# Patient Record
Sex: Male | Born: 2000 | Race: Black or African American | Hispanic: No | Marital: Single | Smoking: Never smoker
Health system: Southern US, Community
[De-identification: ages and names within clinical notes are randomized; demographics above are authoritative.]

## PROBLEM LIST (undated history)

## (undated) DIAGNOSIS — F909 Attention-deficit hyperactivity disorder, unspecified type: Secondary | ICD-10-CM

## (undated) DIAGNOSIS — J45909 Unspecified asthma, uncomplicated: Secondary | ICD-10-CM

## (undated) HISTORY — PX: TONSILLECTOMY: SUR1361

## (undated) HISTORY — PX: BELOW KNEE LEG AMPUTATION: SUR23

---
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
## 2022-01-03 DIAGNOSIS — R262 Difficulty in walking, not elsewhere classified: Secondary | ICD-10-CM | POA: Diagnosis not present

## 2022-01-04 DIAGNOSIS — R262 Difficulty in walking, not elsewhere classified: Secondary | ICD-10-CM | POA: Diagnosis not present

## 2022-01-05 DIAGNOSIS — R262 Difficulty in walking, not elsewhere classified: Secondary | ICD-10-CM | POA: Diagnosis not present

## 2022-01-06 DIAGNOSIS — R262 Difficulty in walking, not elsewhere classified: Secondary | ICD-10-CM | POA: Diagnosis not present

## 2022-01-07 DIAGNOSIS — R262 Difficulty in walking, not elsewhere classified: Secondary | ICD-10-CM | POA: Diagnosis not present

## 2022-01-08 DIAGNOSIS — Z419 Encounter for procedure for purposes other than remedying health state, unspecified: Secondary | ICD-10-CM | POA: Diagnosis not present

## 2022-01-10 DIAGNOSIS — R262 Difficulty in walking, not elsewhere classified: Secondary | ICD-10-CM | POA: Diagnosis not present

## 2022-01-11 DIAGNOSIS — R262 Difficulty in walking, not elsewhere classified: Secondary | ICD-10-CM | POA: Diagnosis not present

## 2022-01-12 DIAGNOSIS — R262 Difficulty in walking, not elsewhere classified: Secondary | ICD-10-CM | POA: Diagnosis not present

## 2022-01-13 DIAGNOSIS — R262 Difficulty in walking, not elsewhere classified: Secondary | ICD-10-CM | POA: Diagnosis not present

## 2022-01-14 DIAGNOSIS — R262 Difficulty in walking, not elsewhere classified: Secondary | ICD-10-CM | POA: Diagnosis not present

## 2022-01-17 DIAGNOSIS — R262 Difficulty in walking, not elsewhere classified: Secondary | ICD-10-CM | POA: Diagnosis not present

## 2022-01-18 DIAGNOSIS — R262 Difficulty in walking, not elsewhere classified: Secondary | ICD-10-CM | POA: Diagnosis not present

## 2022-01-19 DIAGNOSIS — R262 Difficulty in walking, not elsewhere classified: Secondary | ICD-10-CM | POA: Diagnosis not present

## 2022-01-20 DIAGNOSIS — R262 Difficulty in walking, not elsewhere classified: Secondary | ICD-10-CM | POA: Diagnosis not present

## 2022-01-21 DIAGNOSIS — R262 Difficulty in walking, not elsewhere classified: Secondary | ICD-10-CM | POA: Diagnosis not present

## 2022-01-24 DIAGNOSIS — R262 Difficulty in walking, not elsewhere classified: Secondary | ICD-10-CM | POA: Diagnosis not present

## 2022-01-25 DIAGNOSIS — R262 Difficulty in walking, not elsewhere classified: Secondary | ICD-10-CM | POA: Diagnosis not present

## 2022-01-26 DIAGNOSIS — R262 Difficulty in walking, not elsewhere classified: Secondary | ICD-10-CM | POA: Diagnosis not present

## 2022-01-27 DIAGNOSIS — R262 Difficulty in walking, not elsewhere classified: Secondary | ICD-10-CM | POA: Diagnosis not present

## 2022-01-28 DIAGNOSIS — R262 Difficulty in walking, not elsewhere classified: Secondary | ICD-10-CM | POA: Diagnosis not present

## 2022-01-31 DIAGNOSIS — R262 Difficulty in walking, not elsewhere classified: Secondary | ICD-10-CM | POA: Diagnosis not present

## 2022-02-01 DIAGNOSIS — R262 Difficulty in walking, not elsewhere classified: Secondary | ICD-10-CM | POA: Diagnosis not present

## 2022-02-02 DIAGNOSIS — R262 Difficulty in walking, not elsewhere classified: Secondary | ICD-10-CM | POA: Diagnosis not present

## 2022-02-03 DIAGNOSIS — R262 Difficulty in walking, not elsewhere classified: Secondary | ICD-10-CM | POA: Diagnosis not present

## 2022-02-04 DIAGNOSIS — R262 Difficulty in walking, not elsewhere classified: Secondary | ICD-10-CM | POA: Diagnosis not present

## 2022-02-07 DIAGNOSIS — R262 Difficulty in walking, not elsewhere classified: Secondary | ICD-10-CM | POA: Diagnosis not present

## 2022-02-08 DIAGNOSIS — R262 Difficulty in walking, not elsewhere classified: Secondary | ICD-10-CM | POA: Diagnosis not present

## 2022-02-08 DIAGNOSIS — Z419 Encounter for procedure for purposes other than remedying health state, unspecified: Secondary | ICD-10-CM | POA: Diagnosis not present

## 2022-02-09 DIAGNOSIS — R262 Difficulty in walking, not elsewhere classified: Secondary | ICD-10-CM | POA: Diagnosis not present

## 2022-02-10 DIAGNOSIS — R262 Difficulty in walking, not elsewhere classified: Secondary | ICD-10-CM | POA: Diagnosis not present

## 2022-02-11 DIAGNOSIS — R262 Difficulty in walking, not elsewhere classified: Secondary | ICD-10-CM | POA: Diagnosis not present

## 2022-02-14 DIAGNOSIS — R262 Difficulty in walking, not elsewhere classified: Secondary | ICD-10-CM | POA: Diagnosis not present

## 2022-02-15 DIAGNOSIS — R262 Difficulty in walking, not elsewhere classified: Secondary | ICD-10-CM | POA: Diagnosis not present

## 2022-02-16 DIAGNOSIS — R262 Difficulty in walking, not elsewhere classified: Secondary | ICD-10-CM | POA: Diagnosis not present

## 2022-02-17 DIAGNOSIS — R262 Difficulty in walking, not elsewhere classified: Secondary | ICD-10-CM | POA: Diagnosis not present

## 2022-02-18 DIAGNOSIS — R262 Difficulty in walking, not elsewhere classified: Secondary | ICD-10-CM | POA: Diagnosis not present

## 2022-02-21 DIAGNOSIS — R262 Difficulty in walking, not elsewhere classified: Secondary | ICD-10-CM | POA: Diagnosis not present

## 2022-02-22 DIAGNOSIS — Z89511 Acquired absence of right leg below knee: Secondary | ICD-10-CM | POA: Diagnosis not present

## 2022-02-22 DIAGNOSIS — R262 Difficulty in walking, not elsewhere classified: Secondary | ICD-10-CM | POA: Diagnosis not present

## 2022-02-23 DIAGNOSIS — R262 Difficulty in walking, not elsewhere classified: Secondary | ICD-10-CM | POA: Diagnosis not present

## 2022-02-24 DIAGNOSIS — R262 Difficulty in walking, not elsewhere classified: Secondary | ICD-10-CM | POA: Diagnosis not present

## 2022-02-25 DIAGNOSIS — R262 Difficulty in walking, not elsewhere classified: Secondary | ICD-10-CM | POA: Diagnosis not present

## 2022-02-28 DIAGNOSIS — R262 Difficulty in walking, not elsewhere classified: Secondary | ICD-10-CM | POA: Diagnosis not present

## 2022-03-01 DIAGNOSIS — R262 Difficulty in walking, not elsewhere classified: Secondary | ICD-10-CM | POA: Diagnosis not present

## 2022-03-02 DIAGNOSIS — R262 Difficulty in walking, not elsewhere classified: Secondary | ICD-10-CM | POA: Diagnosis not present

## 2022-03-03 DIAGNOSIS — R262 Difficulty in walking, not elsewhere classified: Secondary | ICD-10-CM | POA: Diagnosis not present

## 2022-03-04 DIAGNOSIS — R262 Difficulty in walking, not elsewhere classified: Secondary | ICD-10-CM | POA: Diagnosis not present

## 2022-03-07 DIAGNOSIS — R262 Difficulty in walking, not elsewhere classified: Secondary | ICD-10-CM | POA: Diagnosis not present

## 2022-03-08 DIAGNOSIS — R262 Difficulty in walking, not elsewhere classified: Secondary | ICD-10-CM | POA: Diagnosis not present

## 2022-03-09 DIAGNOSIS — R262 Difficulty in walking, not elsewhere classified: Secondary | ICD-10-CM | POA: Diagnosis not present

## 2022-03-10 DIAGNOSIS — R262 Difficulty in walking, not elsewhere classified: Secondary | ICD-10-CM | POA: Diagnosis not present

## 2022-03-11 DIAGNOSIS — R262 Difficulty in walking, not elsewhere classified: Secondary | ICD-10-CM | POA: Diagnosis not present

## 2022-03-11 DIAGNOSIS — Z419 Encounter for procedure for purposes other than remedying health state, unspecified: Secondary | ICD-10-CM | POA: Diagnosis not present

## 2022-03-14 DIAGNOSIS — R262 Difficulty in walking, not elsewhere classified: Secondary | ICD-10-CM | POA: Diagnosis not present

## 2022-03-14 DIAGNOSIS — U071 COVID-19: Secondary | ICD-10-CM | POA: Diagnosis not present

## 2022-03-14 DIAGNOSIS — J029 Acute pharyngitis, unspecified: Secondary | ICD-10-CM | POA: Diagnosis not present

## 2022-03-15 DIAGNOSIS — R262 Difficulty in walking, not elsewhere classified: Secondary | ICD-10-CM | POA: Diagnosis not present

## 2022-03-16 DIAGNOSIS — R262 Difficulty in walking, not elsewhere classified: Secondary | ICD-10-CM | POA: Diagnosis not present

## 2022-03-17 DIAGNOSIS — R262 Difficulty in walking, not elsewhere classified: Secondary | ICD-10-CM | POA: Diagnosis not present

## 2022-03-21 DIAGNOSIS — R262 Difficulty in walking, not elsewhere classified: Secondary | ICD-10-CM | POA: Diagnosis not present

## 2022-03-22 DIAGNOSIS — R262 Difficulty in walking, not elsewhere classified: Secondary | ICD-10-CM | POA: Diagnosis not present

## 2022-03-23 DIAGNOSIS — R262 Difficulty in walking, not elsewhere classified: Secondary | ICD-10-CM | POA: Diagnosis not present

## 2022-03-24 DIAGNOSIS — R262 Difficulty in walking, not elsewhere classified: Secondary | ICD-10-CM | POA: Diagnosis not present

## 2022-03-28 DIAGNOSIS — R262 Difficulty in walking, not elsewhere classified: Secondary | ICD-10-CM | POA: Diagnosis not present

## 2022-03-29 DIAGNOSIS — R262 Difficulty in walking, not elsewhere classified: Secondary | ICD-10-CM | POA: Diagnosis not present

## 2022-03-30 DIAGNOSIS — R262 Difficulty in walking, not elsewhere classified: Secondary | ICD-10-CM | POA: Diagnosis not present

## 2022-03-31 DIAGNOSIS — R262 Difficulty in walking, not elsewhere classified: Secondary | ICD-10-CM | POA: Diagnosis not present

## 2022-04-04 DIAGNOSIS — R262 Difficulty in walking, not elsewhere classified: Secondary | ICD-10-CM | POA: Diagnosis not present

## 2022-04-05 DIAGNOSIS — R262 Difficulty in walking, not elsewhere classified: Secondary | ICD-10-CM | POA: Diagnosis not present

## 2022-04-06 DIAGNOSIS — R262 Difficulty in walking, not elsewhere classified: Secondary | ICD-10-CM | POA: Diagnosis not present

## 2022-04-07 DIAGNOSIS — R262 Difficulty in walking, not elsewhere classified: Secondary | ICD-10-CM | POA: Diagnosis not present

## 2022-04-10 DIAGNOSIS — Z419 Encounter for procedure for purposes other than remedying health state, unspecified: Secondary | ICD-10-CM | POA: Diagnosis not present

## 2022-04-11 DIAGNOSIS — R262 Difficulty in walking, not elsewhere classified: Secondary | ICD-10-CM | POA: Diagnosis not present

## 2022-04-12 DIAGNOSIS — R262 Difficulty in walking, not elsewhere classified: Secondary | ICD-10-CM | POA: Diagnosis not present

## 2022-04-13 DIAGNOSIS — R262 Difficulty in walking, not elsewhere classified: Secondary | ICD-10-CM | POA: Diagnosis not present

## 2022-04-14 DIAGNOSIS — R262 Difficulty in walking, not elsewhere classified: Secondary | ICD-10-CM | POA: Diagnosis not present

## 2022-04-18 DIAGNOSIS — R262 Difficulty in walking, not elsewhere classified: Secondary | ICD-10-CM | POA: Diagnosis not present

## 2022-04-19 DIAGNOSIS — R262 Difficulty in walking, not elsewhere classified: Secondary | ICD-10-CM | POA: Diagnosis not present

## 2022-04-20 DIAGNOSIS — R262 Difficulty in walking, not elsewhere classified: Secondary | ICD-10-CM | POA: Diagnosis not present

## 2022-04-21 DIAGNOSIS — R262 Difficulty in walking, not elsewhere classified: Secondary | ICD-10-CM | POA: Diagnosis not present

## 2022-04-25 DIAGNOSIS — R262 Difficulty in walking, not elsewhere classified: Secondary | ICD-10-CM | POA: Diagnosis not present

## 2022-04-26 DIAGNOSIS — R262 Difficulty in walking, not elsewhere classified: Secondary | ICD-10-CM | POA: Diagnosis not present

## 2022-04-27 DIAGNOSIS — R262 Difficulty in walking, not elsewhere classified: Secondary | ICD-10-CM | POA: Diagnosis not present

## 2022-04-28 DIAGNOSIS — R262 Difficulty in walking, not elsewhere classified: Secondary | ICD-10-CM | POA: Diagnosis not present

## 2022-05-02 DIAGNOSIS — R262 Difficulty in walking, not elsewhere classified: Secondary | ICD-10-CM | POA: Diagnosis not present

## 2022-05-03 DIAGNOSIS — R262 Difficulty in walking, not elsewhere classified: Secondary | ICD-10-CM | POA: Diagnosis not present

## 2022-05-04 DIAGNOSIS — R262 Difficulty in walking, not elsewhere classified: Secondary | ICD-10-CM | POA: Diagnosis not present

## 2022-05-05 DIAGNOSIS — R262 Difficulty in walking, not elsewhere classified: Secondary | ICD-10-CM | POA: Diagnosis not present

## 2022-05-09 DIAGNOSIS — R262 Difficulty in walking, not elsewhere classified: Secondary | ICD-10-CM | POA: Diagnosis not present

## 2022-05-10 DIAGNOSIS — R262 Difficulty in walking, not elsewhere classified: Secondary | ICD-10-CM | POA: Diagnosis not present

## 2022-05-11 DIAGNOSIS — R262 Difficulty in walking, not elsewhere classified: Secondary | ICD-10-CM | POA: Diagnosis not present

## 2022-05-11 DIAGNOSIS — Z419 Encounter for procedure for purposes other than remedying health state, unspecified: Secondary | ICD-10-CM | POA: Diagnosis not present

## 2022-05-12 DIAGNOSIS — R262 Difficulty in walking, not elsewhere classified: Secondary | ICD-10-CM | POA: Diagnosis not present

## 2022-05-16 DIAGNOSIS — R262 Difficulty in walking, not elsewhere classified: Secondary | ICD-10-CM | POA: Diagnosis not present

## 2022-05-17 DIAGNOSIS — R262 Difficulty in walking, not elsewhere classified: Secondary | ICD-10-CM | POA: Diagnosis not present

## 2022-05-18 DIAGNOSIS — R262 Difficulty in walking, not elsewhere classified: Secondary | ICD-10-CM | POA: Diagnosis not present

## 2022-05-19 DIAGNOSIS — R262 Difficulty in walking, not elsewhere classified: Secondary | ICD-10-CM | POA: Diagnosis not present

## 2022-05-20 DIAGNOSIS — R262 Difficulty in walking, not elsewhere classified: Secondary | ICD-10-CM | POA: Diagnosis not present

## 2022-05-23 DIAGNOSIS — R262 Difficulty in walking, not elsewhere classified: Secondary | ICD-10-CM | POA: Diagnosis not present

## 2022-05-24 DIAGNOSIS — R262 Difficulty in walking, not elsewhere classified: Secondary | ICD-10-CM | POA: Diagnosis not present

## 2022-05-25 DIAGNOSIS — R262 Difficulty in walking, not elsewhere classified: Secondary | ICD-10-CM | POA: Diagnosis not present

## 2022-05-26 DIAGNOSIS — R262 Difficulty in walking, not elsewhere classified: Secondary | ICD-10-CM | POA: Diagnosis not present

## 2022-05-27 DIAGNOSIS — R262 Difficulty in walking, not elsewhere classified: Secondary | ICD-10-CM | POA: Diagnosis not present

## 2022-05-30 DIAGNOSIS — R262 Difficulty in walking, not elsewhere classified: Secondary | ICD-10-CM | POA: Diagnosis not present

## 2022-05-31 DIAGNOSIS — R262 Difficulty in walking, not elsewhere classified: Secondary | ICD-10-CM | POA: Diagnosis not present

## 2022-06-01 DIAGNOSIS — R262 Difficulty in walking, not elsewhere classified: Secondary | ICD-10-CM | POA: Diagnosis not present

## 2022-06-02 DIAGNOSIS — R262 Difficulty in walking, not elsewhere classified: Secondary | ICD-10-CM | POA: Diagnosis not present

## 2022-06-03 DIAGNOSIS — R262 Difficulty in walking, not elsewhere classified: Secondary | ICD-10-CM | POA: Diagnosis not present

## 2022-06-06 DIAGNOSIS — R262 Difficulty in walking, not elsewhere classified: Secondary | ICD-10-CM | POA: Diagnosis not present

## 2022-06-07 DIAGNOSIS — R262 Difficulty in walking, not elsewhere classified: Secondary | ICD-10-CM | POA: Diagnosis not present

## 2022-06-08 DIAGNOSIS — R262 Difficulty in walking, not elsewhere classified: Secondary | ICD-10-CM | POA: Diagnosis not present

## 2022-06-09 DIAGNOSIS — R262 Difficulty in walking, not elsewhere classified: Secondary | ICD-10-CM | POA: Diagnosis not present

## 2022-06-10 DIAGNOSIS — Z419 Encounter for procedure for purposes other than remedying health state, unspecified: Secondary | ICD-10-CM | POA: Diagnosis not present

## 2022-06-10 DIAGNOSIS — R262 Difficulty in walking, not elsewhere classified: Secondary | ICD-10-CM | POA: Diagnosis not present

## 2022-06-13 DIAGNOSIS — R262 Difficulty in walking, not elsewhere classified: Secondary | ICD-10-CM | POA: Diagnosis not present

## 2022-06-14 DIAGNOSIS — R262 Difficulty in walking, not elsewhere classified: Secondary | ICD-10-CM | POA: Diagnosis not present

## 2022-06-15 DIAGNOSIS — R262 Difficulty in walking, not elsewhere classified: Secondary | ICD-10-CM | POA: Diagnosis not present

## 2022-06-16 DIAGNOSIS — R262 Difficulty in walking, not elsewhere classified: Secondary | ICD-10-CM | POA: Diagnosis not present

## 2022-06-17 DIAGNOSIS — R262 Difficulty in walking, not elsewhere classified: Secondary | ICD-10-CM | POA: Diagnosis not present

## 2022-06-20 DIAGNOSIS — R262 Difficulty in walking, not elsewhere classified: Secondary | ICD-10-CM | POA: Diagnosis not present

## 2022-06-21 DIAGNOSIS — R262 Difficulty in walking, not elsewhere classified: Secondary | ICD-10-CM | POA: Diagnosis not present

## 2022-06-22 DIAGNOSIS — R262 Difficulty in walking, not elsewhere classified: Secondary | ICD-10-CM | POA: Diagnosis not present

## 2022-06-23 DIAGNOSIS — R262 Difficulty in walking, not elsewhere classified: Secondary | ICD-10-CM | POA: Diagnosis not present

## 2022-06-24 DIAGNOSIS — R262 Difficulty in walking, not elsewhere classified: Secondary | ICD-10-CM | POA: Diagnosis not present

## 2022-06-27 DIAGNOSIS — R262 Difficulty in walking, not elsewhere classified: Secondary | ICD-10-CM | POA: Diagnosis not present

## 2022-06-28 DIAGNOSIS — R262 Difficulty in walking, not elsewhere classified: Secondary | ICD-10-CM | POA: Diagnosis not present

## 2022-06-29 DIAGNOSIS — R262 Difficulty in walking, not elsewhere classified: Secondary | ICD-10-CM | POA: Diagnosis not present

## 2022-06-30 DIAGNOSIS — R262 Difficulty in walking, not elsewhere classified: Secondary | ICD-10-CM | POA: Diagnosis not present

## 2022-07-01 DIAGNOSIS — R262 Difficulty in walking, not elsewhere classified: Secondary | ICD-10-CM | POA: Diagnosis not present

## 2022-07-04 DIAGNOSIS — R262 Difficulty in walking, not elsewhere classified: Secondary | ICD-10-CM | POA: Diagnosis not present

## 2022-07-05 DIAGNOSIS — R262 Difficulty in walking, not elsewhere classified: Secondary | ICD-10-CM | POA: Diagnosis not present

## 2022-07-06 DIAGNOSIS — R262 Difficulty in walking, not elsewhere classified: Secondary | ICD-10-CM | POA: Diagnosis not present

## 2022-07-07 DIAGNOSIS — R262 Difficulty in walking, not elsewhere classified: Secondary | ICD-10-CM | POA: Diagnosis not present

## 2022-07-08 DIAGNOSIS — R262 Difficulty in walking, not elsewhere classified: Secondary | ICD-10-CM | POA: Diagnosis not present

## 2022-07-11 DIAGNOSIS — R262 Difficulty in walking, not elsewhere classified: Secondary | ICD-10-CM | POA: Diagnosis not present

## 2022-07-12 DIAGNOSIS — R262 Difficulty in walking, not elsewhere classified: Secondary | ICD-10-CM | POA: Diagnosis not present

## 2022-07-13 DIAGNOSIS — R262 Difficulty in walking, not elsewhere classified: Secondary | ICD-10-CM | POA: Diagnosis not present

## 2022-07-14 DIAGNOSIS — R262 Difficulty in walking, not elsewhere classified: Secondary | ICD-10-CM | POA: Diagnosis not present

## 2022-07-15 DIAGNOSIS — R262 Difficulty in walking, not elsewhere classified: Secondary | ICD-10-CM | POA: Diagnosis not present

## 2022-07-18 DIAGNOSIS — R262 Difficulty in walking, not elsewhere classified: Secondary | ICD-10-CM | POA: Diagnosis not present

## 2022-07-19 DIAGNOSIS — R262 Difficulty in walking, not elsewhere classified: Secondary | ICD-10-CM | POA: Diagnosis not present

## 2022-07-20 DIAGNOSIS — R262 Difficulty in walking, not elsewhere classified: Secondary | ICD-10-CM | POA: Diagnosis not present

## 2022-07-21 DIAGNOSIS — R262 Difficulty in walking, not elsewhere classified: Secondary | ICD-10-CM | POA: Diagnosis not present

## 2022-07-22 DIAGNOSIS — R262 Difficulty in walking, not elsewhere classified: Secondary | ICD-10-CM | POA: Diagnosis not present

## 2022-07-25 DIAGNOSIS — R262 Difficulty in walking, not elsewhere classified: Secondary | ICD-10-CM | POA: Diagnosis not present

## 2022-07-26 DIAGNOSIS — R262 Difficulty in walking, not elsewhere classified: Secondary | ICD-10-CM | POA: Diagnosis not present

## 2022-07-27 DIAGNOSIS — R262 Difficulty in walking, not elsewhere classified: Secondary | ICD-10-CM | POA: Diagnosis not present

## 2022-07-28 DIAGNOSIS — R262 Difficulty in walking, not elsewhere classified: Secondary | ICD-10-CM | POA: Diagnosis not present

## 2022-07-29 DIAGNOSIS — R262 Difficulty in walking, not elsewhere classified: Secondary | ICD-10-CM | POA: Diagnosis not present

## 2022-08-01 DIAGNOSIS — R262 Difficulty in walking, not elsewhere classified: Secondary | ICD-10-CM | POA: Diagnosis not present

## 2022-08-02 DIAGNOSIS — R262 Difficulty in walking, not elsewhere classified: Secondary | ICD-10-CM | POA: Diagnosis not present

## 2022-08-03 DIAGNOSIS — R262 Difficulty in walking, not elsewhere classified: Secondary | ICD-10-CM | POA: Diagnosis not present

## 2022-08-04 DIAGNOSIS — R262 Difficulty in walking, not elsewhere classified: Secondary | ICD-10-CM | POA: Diagnosis not present

## 2022-08-05 DIAGNOSIS — R262 Difficulty in walking, not elsewhere classified: Secondary | ICD-10-CM | POA: Diagnosis not present

## 2022-08-08 DIAGNOSIS — R262 Difficulty in walking, not elsewhere classified: Secondary | ICD-10-CM | POA: Diagnosis not present

## 2022-08-09 DIAGNOSIS — R262 Difficulty in walking, not elsewhere classified: Secondary | ICD-10-CM | POA: Diagnosis not present

## 2022-08-10 DIAGNOSIS — R262 Difficulty in walking, not elsewhere classified: Secondary | ICD-10-CM | POA: Diagnosis not present

## 2022-08-11 DIAGNOSIS — Z419 Encounter for procedure for purposes other than remedying health state, unspecified: Secondary | ICD-10-CM | POA: Diagnosis not present

## 2022-08-11 DIAGNOSIS — R262 Difficulty in walking, not elsewhere classified: Secondary | ICD-10-CM | POA: Diagnosis not present

## 2022-08-12 DIAGNOSIS — R262 Difficulty in walking, not elsewhere classified: Secondary | ICD-10-CM | POA: Diagnosis not present

## 2022-08-16 DIAGNOSIS — R262 Difficulty in walking, not elsewhere classified: Secondary | ICD-10-CM | POA: Diagnosis not present

## 2022-08-17 DIAGNOSIS — R262 Difficulty in walking, not elsewhere classified: Secondary | ICD-10-CM | POA: Diagnosis not present

## 2022-08-18 DIAGNOSIS — R262 Difficulty in walking, not elsewhere classified: Secondary | ICD-10-CM | POA: Diagnosis not present

## 2022-08-19 DIAGNOSIS — R262 Difficulty in walking, not elsewhere classified: Secondary | ICD-10-CM | POA: Diagnosis not present

## 2022-08-22 DIAGNOSIS — R262 Difficulty in walking, not elsewhere classified: Secondary | ICD-10-CM | POA: Diagnosis not present

## 2022-08-23 DIAGNOSIS — R262 Difficulty in walking, not elsewhere classified: Secondary | ICD-10-CM | POA: Diagnosis not present

## 2022-08-24 DIAGNOSIS — R262 Difficulty in walking, not elsewhere classified: Secondary | ICD-10-CM | POA: Diagnosis not present

## 2022-08-25 DIAGNOSIS — R262 Difficulty in walking, not elsewhere classified: Secondary | ICD-10-CM | POA: Diagnosis not present

## 2022-08-26 DIAGNOSIS — R262 Difficulty in walking, not elsewhere classified: Secondary | ICD-10-CM | POA: Diagnosis not present

## 2022-08-29 DIAGNOSIS — R262 Difficulty in walking, not elsewhere classified: Secondary | ICD-10-CM | POA: Diagnosis not present

## 2022-08-30 DIAGNOSIS — R262 Difficulty in walking, not elsewhere classified: Secondary | ICD-10-CM | POA: Diagnosis not present

## 2022-08-31 DIAGNOSIS — R262 Difficulty in walking, not elsewhere classified: Secondary | ICD-10-CM | POA: Diagnosis not present

## 2022-09-01 DIAGNOSIS — R262 Difficulty in walking, not elsewhere classified: Secondary | ICD-10-CM | POA: Diagnosis not present

## 2022-09-02 DIAGNOSIS — R262 Difficulty in walking, not elsewhere classified: Secondary | ICD-10-CM | POA: Diagnosis not present

## 2022-09-05 DIAGNOSIS — R262 Difficulty in walking, not elsewhere classified: Secondary | ICD-10-CM | POA: Diagnosis not present

## 2022-09-06 DIAGNOSIS — R262 Difficulty in walking, not elsewhere classified: Secondary | ICD-10-CM | POA: Diagnosis not present

## 2022-09-07 DIAGNOSIS — R262 Difficulty in walking, not elsewhere classified: Secondary | ICD-10-CM | POA: Diagnosis not present

## 2022-09-08 DIAGNOSIS — R262 Difficulty in walking, not elsewhere classified: Secondary | ICD-10-CM | POA: Diagnosis not present

## 2022-09-09 DIAGNOSIS — R262 Difficulty in walking, not elsewhere classified: Secondary | ICD-10-CM | POA: Diagnosis not present

## 2022-09-09 DIAGNOSIS — Z419 Encounter for procedure for purposes other than remedying health state, unspecified: Secondary | ICD-10-CM | POA: Diagnosis not present

## 2022-09-12 DIAGNOSIS — R262 Difficulty in walking, not elsewhere classified: Secondary | ICD-10-CM | POA: Diagnosis not present

## 2022-09-13 DIAGNOSIS — R262 Difficulty in walking, not elsewhere classified: Secondary | ICD-10-CM | POA: Diagnosis not present

## 2022-09-14 DIAGNOSIS — R262 Difficulty in walking, not elsewhere classified: Secondary | ICD-10-CM | POA: Diagnosis not present

## 2022-09-15 DIAGNOSIS — R262 Difficulty in walking, not elsewhere classified: Secondary | ICD-10-CM | POA: Diagnosis not present

## 2022-09-16 DIAGNOSIS — R262 Difficulty in walking, not elsewhere classified: Secondary | ICD-10-CM | POA: Diagnosis not present

## 2022-09-19 DIAGNOSIS — R262 Difficulty in walking, not elsewhere classified: Secondary | ICD-10-CM | POA: Diagnosis not present

## 2022-09-20 DIAGNOSIS — R262 Difficulty in walking, not elsewhere classified: Secondary | ICD-10-CM | POA: Diagnosis not present

## 2022-09-21 DIAGNOSIS — R262 Difficulty in walking, not elsewhere classified: Secondary | ICD-10-CM | POA: Diagnosis not present

## 2022-09-22 DIAGNOSIS — R262 Difficulty in walking, not elsewhere classified: Secondary | ICD-10-CM | POA: Diagnosis not present

## 2022-09-23 DIAGNOSIS — R262 Difficulty in walking, not elsewhere classified: Secondary | ICD-10-CM | POA: Diagnosis not present

## 2022-09-26 DIAGNOSIS — M542 Cervicalgia: Secondary | ICD-10-CM | POA: Diagnosis not present

## 2022-09-26 DIAGNOSIS — G44311 Acute post-traumatic headache, intractable: Secondary | ICD-10-CM | POA: Diagnosis not present

## 2022-09-26 DIAGNOSIS — R262 Difficulty in walking, not elsewhere classified: Secondary | ICD-10-CM | POA: Diagnosis not present

## 2022-09-26 DIAGNOSIS — M546 Pain in thoracic spine: Secondary | ICD-10-CM | POA: Diagnosis not present

## 2022-09-27 DIAGNOSIS — R262 Difficulty in walking, not elsewhere classified: Secondary | ICD-10-CM | POA: Diagnosis not present

## 2022-09-28 DIAGNOSIS — R262 Difficulty in walking, not elsewhere classified: Secondary | ICD-10-CM | POA: Diagnosis not present

## 2022-09-29 DIAGNOSIS — R262 Difficulty in walking, not elsewhere classified: Secondary | ICD-10-CM | POA: Diagnosis not present

## 2022-10-03 DIAGNOSIS — R262 Difficulty in walking, not elsewhere classified: Secondary | ICD-10-CM | POA: Diagnosis not present

## 2022-10-04 DIAGNOSIS — R262 Difficulty in walking, not elsewhere classified: Secondary | ICD-10-CM | POA: Diagnosis not present

## 2022-10-05 DIAGNOSIS — R262 Difficulty in walking, not elsewhere classified: Secondary | ICD-10-CM | POA: Diagnosis not present

## 2022-10-06 DIAGNOSIS — R262 Difficulty in walking, not elsewhere classified: Secondary | ICD-10-CM | POA: Diagnosis not present

## 2022-10-10 DIAGNOSIS — R262 Difficulty in walking, not elsewhere classified: Secondary | ICD-10-CM | POA: Diagnosis not present

## 2022-10-10 DIAGNOSIS — Z419 Encounter for procedure for purposes other than remedying health state, unspecified: Secondary | ICD-10-CM | POA: Diagnosis not present

## 2022-10-11 DIAGNOSIS — R262 Difficulty in walking, not elsewhere classified: Secondary | ICD-10-CM | POA: Diagnosis not present

## 2022-10-12 DIAGNOSIS — R262 Difficulty in walking, not elsewhere classified: Secondary | ICD-10-CM | POA: Diagnosis not present

## 2022-10-13 DIAGNOSIS — R262 Difficulty in walking, not elsewhere classified: Secondary | ICD-10-CM | POA: Diagnosis not present

## 2022-10-17 DIAGNOSIS — R262 Difficulty in walking, not elsewhere classified: Secondary | ICD-10-CM | POA: Diagnosis not present

## 2022-10-18 DIAGNOSIS — R262 Difficulty in walking, not elsewhere classified: Secondary | ICD-10-CM | POA: Diagnosis not present

## 2022-10-19 DIAGNOSIS — R262 Difficulty in walking, not elsewhere classified: Secondary | ICD-10-CM | POA: Diagnosis not present

## 2022-10-20 DIAGNOSIS — R262 Difficulty in walking, not elsewhere classified: Secondary | ICD-10-CM | POA: Diagnosis not present

## 2022-10-24 DIAGNOSIS — R262 Difficulty in walking, not elsewhere classified: Secondary | ICD-10-CM | POA: Diagnosis not present

## 2022-10-25 DIAGNOSIS — R262 Difficulty in walking, not elsewhere classified: Secondary | ICD-10-CM | POA: Diagnosis not present

## 2022-10-26 DIAGNOSIS — R262 Difficulty in walking, not elsewhere classified: Secondary | ICD-10-CM | POA: Diagnosis not present

## 2022-10-27 DIAGNOSIS — R262 Difficulty in walking, not elsewhere classified: Secondary | ICD-10-CM | POA: Diagnosis not present

## 2022-10-31 DIAGNOSIS — R262 Difficulty in walking, not elsewhere classified: Secondary | ICD-10-CM | POA: Diagnosis not present

## 2022-11-01 DIAGNOSIS — Z20822 Contact with and (suspected) exposure to covid-19: Secondary | ICD-10-CM | POA: Diagnosis not present

## 2022-11-01 DIAGNOSIS — R6889 Other general symptoms and signs: Secondary | ICD-10-CM | POA: Diagnosis not present

## 2022-11-01 DIAGNOSIS — R059 Cough, unspecified: Secondary | ICD-10-CM | POA: Diagnosis not present

## 2022-11-02 DIAGNOSIS — R262 Difficulty in walking, not elsewhere classified: Secondary | ICD-10-CM | POA: Diagnosis not present

## 2022-11-03 DIAGNOSIS — R262 Difficulty in walking, not elsewhere classified: Secondary | ICD-10-CM | POA: Diagnosis not present

## 2022-11-04 DIAGNOSIS — J4521 Mild intermittent asthma with (acute) exacerbation: Secondary | ICD-10-CM | POA: Diagnosis not present

## 2022-11-04 DIAGNOSIS — R262 Difficulty in walking, not elsewhere classified: Secondary | ICD-10-CM | POA: Diagnosis not present

## 2022-11-04 DIAGNOSIS — J029 Acute pharyngitis, unspecified: Secondary | ICD-10-CM | POA: Diagnosis not present

## 2022-11-04 DIAGNOSIS — R062 Wheezing: Secondary | ICD-10-CM | POA: Diagnosis not present

## 2022-11-07 DIAGNOSIS — R262 Difficulty in walking, not elsewhere classified: Secondary | ICD-10-CM | POA: Diagnosis not present

## 2022-11-08 DIAGNOSIS — R262 Difficulty in walking, not elsewhere classified: Secondary | ICD-10-CM | POA: Diagnosis not present

## 2022-11-09 DIAGNOSIS — R262 Difficulty in walking, not elsewhere classified: Secondary | ICD-10-CM | POA: Diagnosis not present

## 2022-11-09 DIAGNOSIS — Z419 Encounter for procedure for purposes other than remedying health state, unspecified: Secondary | ICD-10-CM | POA: Diagnosis not present

## 2022-11-10 DIAGNOSIS — R262 Difficulty in walking, not elsewhere classified: Secondary | ICD-10-CM | POA: Diagnosis not present

## 2022-11-14 DIAGNOSIS — R262 Difficulty in walking, not elsewhere classified: Secondary | ICD-10-CM | POA: Diagnosis not present

## 2022-11-15 DIAGNOSIS — R262 Difficulty in walking, not elsewhere classified: Secondary | ICD-10-CM | POA: Diagnosis not present

## 2022-11-16 DIAGNOSIS — R262 Difficulty in walking, not elsewhere classified: Secondary | ICD-10-CM | POA: Diagnosis not present

## 2022-11-17 DIAGNOSIS — R262 Difficulty in walking, not elsewhere classified: Secondary | ICD-10-CM | POA: Diagnosis not present

## 2022-11-21 DIAGNOSIS — R262 Difficulty in walking, not elsewhere classified: Secondary | ICD-10-CM | POA: Diagnosis not present

## 2022-11-22 DIAGNOSIS — R262 Difficulty in walking, not elsewhere classified: Secondary | ICD-10-CM | POA: Diagnosis not present

## 2022-11-23 DIAGNOSIS — R262 Difficulty in walking, not elsewhere classified: Secondary | ICD-10-CM | POA: Diagnosis not present

## 2022-11-24 DIAGNOSIS — R262 Difficulty in walking, not elsewhere classified: Secondary | ICD-10-CM | POA: Diagnosis not present

## 2022-11-28 DIAGNOSIS — R262 Difficulty in walking, not elsewhere classified: Secondary | ICD-10-CM | POA: Diagnosis not present

## 2022-11-29 DIAGNOSIS — R262 Difficulty in walking, not elsewhere classified: Secondary | ICD-10-CM | POA: Diagnosis not present

## 2022-11-30 DIAGNOSIS — R262 Difficulty in walking, not elsewhere classified: Secondary | ICD-10-CM | POA: Diagnosis not present

## 2022-12-01 DIAGNOSIS — R262 Difficulty in walking, not elsewhere classified: Secondary | ICD-10-CM | POA: Diagnosis not present

## 2022-12-05 DIAGNOSIS — R262 Difficulty in walking, not elsewhere classified: Secondary | ICD-10-CM | POA: Diagnosis not present

## 2022-12-06 DIAGNOSIS — R262 Difficulty in walking, not elsewhere classified: Secondary | ICD-10-CM | POA: Diagnosis not present

## 2022-12-07 DIAGNOSIS — R262 Difficulty in walking, not elsewhere classified: Secondary | ICD-10-CM | POA: Diagnosis not present

## 2022-12-08 DIAGNOSIS — R262 Difficulty in walking, not elsewhere classified: Secondary | ICD-10-CM | POA: Diagnosis not present

## 2022-12-10 DIAGNOSIS — Z419 Encounter for procedure for purposes other than remedying health state, unspecified: Secondary | ICD-10-CM | POA: Diagnosis not present

## 2022-12-12 DIAGNOSIS — R262 Difficulty in walking, not elsewhere classified: Secondary | ICD-10-CM | POA: Diagnosis not present

## 2022-12-13 DIAGNOSIS — R262 Difficulty in walking, not elsewhere classified: Secondary | ICD-10-CM | POA: Diagnosis not present

## 2022-12-14 DIAGNOSIS — R262 Difficulty in walking, not elsewhere classified: Secondary | ICD-10-CM | POA: Diagnosis not present

## 2022-12-15 DIAGNOSIS — R262 Difficulty in walking, not elsewhere classified: Secondary | ICD-10-CM | POA: Diagnosis not present

## 2022-12-19 DIAGNOSIS — R262 Difficulty in walking, not elsewhere classified: Secondary | ICD-10-CM | POA: Diagnosis not present

## 2022-12-20 DIAGNOSIS — R262 Difficulty in walking, not elsewhere classified: Secondary | ICD-10-CM | POA: Diagnosis not present

## 2022-12-21 DIAGNOSIS — R262 Difficulty in walking, not elsewhere classified: Secondary | ICD-10-CM | POA: Diagnosis not present

## 2022-12-23 DIAGNOSIS — R262 Difficulty in walking, not elsewhere classified: Secondary | ICD-10-CM | POA: Diagnosis not present

## 2022-12-26 DIAGNOSIS — R262 Difficulty in walking, not elsewhere classified: Secondary | ICD-10-CM | POA: Diagnosis not present

## 2022-12-27 DIAGNOSIS — R262 Difficulty in walking, not elsewhere classified: Secondary | ICD-10-CM | POA: Diagnosis not present

## 2022-12-28 DIAGNOSIS — R262 Difficulty in walking, not elsewhere classified: Secondary | ICD-10-CM | POA: Diagnosis not present

## 2022-12-29 DIAGNOSIS — R262 Difficulty in walking, not elsewhere classified: Secondary | ICD-10-CM | POA: Diagnosis not present

## 2023-01-02 DIAGNOSIS — R262 Difficulty in walking, not elsewhere classified: Secondary | ICD-10-CM | POA: Diagnosis not present

## 2023-01-03 DIAGNOSIS — R262 Difficulty in walking, not elsewhere classified: Secondary | ICD-10-CM | POA: Diagnosis not present

## 2023-01-04 DIAGNOSIS — R262 Difficulty in walking, not elsewhere classified: Secondary | ICD-10-CM | POA: Diagnosis not present

## 2023-01-05 DIAGNOSIS — R262 Difficulty in walking, not elsewhere classified: Secondary | ICD-10-CM | POA: Diagnosis not present

## 2023-01-09 DIAGNOSIS — Z419 Encounter for procedure for purposes other than remedying health state, unspecified: Secondary | ICD-10-CM | POA: Diagnosis not present

## 2023-01-09 DIAGNOSIS — R262 Difficulty in walking, not elsewhere classified: Secondary | ICD-10-CM | POA: Diagnosis not present

## 2023-01-10 DIAGNOSIS — R262 Difficulty in walking, not elsewhere classified: Secondary | ICD-10-CM | POA: Diagnosis not present

## 2023-01-11 DIAGNOSIS — R262 Difficulty in walking, not elsewhere classified: Secondary | ICD-10-CM | POA: Diagnosis not present

## 2023-01-12 DIAGNOSIS — R262 Difficulty in walking, not elsewhere classified: Secondary | ICD-10-CM | POA: Diagnosis not present

## 2023-01-16 DIAGNOSIS — R262 Difficulty in walking, not elsewhere classified: Secondary | ICD-10-CM | POA: Diagnosis not present

## 2023-01-17 DIAGNOSIS — R262 Difficulty in walking, not elsewhere classified: Secondary | ICD-10-CM | POA: Diagnosis not present

## 2023-01-18 DIAGNOSIS — R262 Difficulty in walking, not elsewhere classified: Secondary | ICD-10-CM | POA: Diagnosis not present

## 2023-01-19 DIAGNOSIS — R262 Difficulty in walking, not elsewhere classified: Secondary | ICD-10-CM | POA: Diagnosis not present

## 2023-01-23 DIAGNOSIS — R262 Difficulty in walking, not elsewhere classified: Secondary | ICD-10-CM | POA: Diagnosis not present

## 2023-01-24 DIAGNOSIS — R262 Difficulty in walking, not elsewhere classified: Secondary | ICD-10-CM | POA: Diagnosis not present

## 2023-01-25 DIAGNOSIS — R262 Difficulty in walking, not elsewhere classified: Secondary | ICD-10-CM | POA: Diagnosis not present

## 2023-01-26 DIAGNOSIS — R262 Difficulty in walking, not elsewhere classified: Secondary | ICD-10-CM | POA: Diagnosis not present

## 2023-01-30 DIAGNOSIS — R262 Difficulty in walking, not elsewhere classified: Secondary | ICD-10-CM | POA: Diagnosis not present

## 2023-01-31 DIAGNOSIS — R262 Difficulty in walking, not elsewhere classified: Secondary | ICD-10-CM | POA: Diagnosis not present

## 2023-02-01 DIAGNOSIS — R262 Difficulty in walking, not elsewhere classified: Secondary | ICD-10-CM | POA: Diagnosis not present

## 2023-02-03 DIAGNOSIS — R262 Difficulty in walking, not elsewhere classified: Secondary | ICD-10-CM | POA: Diagnosis not present

## 2023-02-06 DIAGNOSIS — R262 Difficulty in walking, not elsewhere classified: Secondary | ICD-10-CM | POA: Diagnosis not present

## 2023-02-07 DIAGNOSIS — R262 Difficulty in walking, not elsewhere classified: Secondary | ICD-10-CM | POA: Diagnosis not present

## 2023-02-08 DIAGNOSIS — R262 Difficulty in walking, not elsewhere classified: Secondary | ICD-10-CM | POA: Diagnosis not present

## 2023-02-09 DIAGNOSIS — Z419 Encounter for procedure for purposes other than remedying health state, unspecified: Secondary | ICD-10-CM | POA: Diagnosis not present

## 2023-02-09 DIAGNOSIS — R262 Difficulty in walking, not elsewhere classified: Secondary | ICD-10-CM | POA: Diagnosis not present

## 2023-02-13 DIAGNOSIS — R262 Difficulty in walking, not elsewhere classified: Secondary | ICD-10-CM | POA: Diagnosis not present

## 2023-02-14 DIAGNOSIS — R262 Difficulty in walking, not elsewhere classified: Secondary | ICD-10-CM | POA: Diagnosis not present

## 2023-02-15 DIAGNOSIS — R262 Difficulty in walking, not elsewhere classified: Secondary | ICD-10-CM | POA: Diagnosis not present

## 2023-02-16 DIAGNOSIS — R262 Difficulty in walking, not elsewhere classified: Secondary | ICD-10-CM | POA: Diagnosis not present

## 2023-02-20 DIAGNOSIS — R262 Difficulty in walking, not elsewhere classified: Secondary | ICD-10-CM | POA: Diagnosis not present

## 2023-02-21 DIAGNOSIS — R262 Difficulty in walking, not elsewhere classified: Secondary | ICD-10-CM | POA: Diagnosis not present

## 2023-02-22 DIAGNOSIS — R262 Difficulty in walking, not elsewhere classified: Secondary | ICD-10-CM | POA: Diagnosis not present

## 2023-02-23 DIAGNOSIS — R262 Difficulty in walking, not elsewhere classified: Secondary | ICD-10-CM | POA: Diagnosis not present

## 2023-02-27 DIAGNOSIS — R262 Difficulty in walking, not elsewhere classified: Secondary | ICD-10-CM | POA: Diagnosis not present

## 2023-02-28 DIAGNOSIS — R262 Difficulty in walking, not elsewhere classified: Secondary | ICD-10-CM | POA: Diagnosis not present

## 2023-03-01 DIAGNOSIS — R262 Difficulty in walking, not elsewhere classified: Secondary | ICD-10-CM | POA: Diagnosis not present

## 2023-03-02 DIAGNOSIS — R262 Difficulty in walking, not elsewhere classified: Secondary | ICD-10-CM | POA: Diagnosis not present

## 2023-03-06 DIAGNOSIS — R262 Difficulty in walking, not elsewhere classified: Secondary | ICD-10-CM | POA: Diagnosis not present

## 2023-03-07 DIAGNOSIS — R262 Difficulty in walking, not elsewhere classified: Secondary | ICD-10-CM | POA: Diagnosis not present

## 2023-03-08 DIAGNOSIS — R262 Difficulty in walking, not elsewhere classified: Secondary | ICD-10-CM | POA: Diagnosis not present

## 2023-03-09 DIAGNOSIS — R262 Difficulty in walking, not elsewhere classified: Secondary | ICD-10-CM | POA: Diagnosis not present

## 2023-03-12 DIAGNOSIS — Z419 Encounter for procedure for purposes other than remedying health state, unspecified: Secondary | ICD-10-CM | POA: Diagnosis not present

## 2023-03-13 DIAGNOSIS — R262 Difficulty in walking, not elsewhere classified: Secondary | ICD-10-CM | POA: Diagnosis not present

## 2023-03-14 DIAGNOSIS — R262 Difficulty in walking, not elsewhere classified: Secondary | ICD-10-CM | POA: Diagnosis not present

## 2023-03-15 DIAGNOSIS — Z978 Presence of other specified devices: Secondary | ICD-10-CM | POA: Diagnosis not present

## 2023-03-15 DIAGNOSIS — R238 Other skin changes: Secondary | ICD-10-CM | POA: Diagnosis not present

## 2023-03-15 DIAGNOSIS — Z89511 Acquired absence of right leg below knee: Secondary | ICD-10-CM | POA: Diagnosis not present

## 2023-03-15 DIAGNOSIS — R262 Difficulty in walking, not elsewhere classified: Secondary | ICD-10-CM | POA: Diagnosis not present

## 2023-03-15 DIAGNOSIS — F4310 Post-traumatic stress disorder, unspecified: Secondary | ICD-10-CM | POA: Diagnosis not present

## 2023-03-16 DIAGNOSIS — R262 Difficulty in walking, not elsewhere classified: Secondary | ICD-10-CM | POA: Diagnosis not present

## 2023-03-20 DIAGNOSIS — R262 Difficulty in walking, not elsewhere classified: Secondary | ICD-10-CM | POA: Diagnosis not present

## 2023-03-21 DIAGNOSIS — R262 Difficulty in walking, not elsewhere classified: Secondary | ICD-10-CM | POA: Diagnosis not present

## 2023-03-22 DIAGNOSIS — R262 Difficulty in walking, not elsewhere classified: Secondary | ICD-10-CM | POA: Diagnosis not present

## 2023-03-23 DIAGNOSIS — R262 Difficulty in walking, not elsewhere classified: Secondary | ICD-10-CM | POA: Diagnosis not present

## 2023-03-24 DIAGNOSIS — R262 Difficulty in walking, not elsewhere classified: Secondary | ICD-10-CM | POA: Diagnosis not present

## 2023-03-27 DIAGNOSIS — R262 Difficulty in walking, not elsewhere classified: Secondary | ICD-10-CM | POA: Diagnosis not present

## 2023-03-28 DIAGNOSIS — R262 Difficulty in walking, not elsewhere classified: Secondary | ICD-10-CM | POA: Diagnosis not present

## 2023-03-29 DIAGNOSIS — R262 Difficulty in walking, not elsewhere classified: Secondary | ICD-10-CM | POA: Diagnosis not present

## 2023-03-30 DIAGNOSIS — R262 Difficulty in walking, not elsewhere classified: Secondary | ICD-10-CM | POA: Diagnosis not present

## 2023-03-31 DIAGNOSIS — R262 Difficulty in walking, not elsewhere classified: Secondary | ICD-10-CM | POA: Diagnosis not present

## 2023-04-03 DIAGNOSIS — R262 Difficulty in walking, not elsewhere classified: Secondary | ICD-10-CM | POA: Diagnosis not present

## 2023-04-04 DIAGNOSIS — R262 Difficulty in walking, not elsewhere classified: Secondary | ICD-10-CM | POA: Diagnosis not present

## 2023-04-05 DIAGNOSIS — R262 Difficulty in walking, not elsewhere classified: Secondary | ICD-10-CM | POA: Diagnosis not present

## 2023-04-06 DIAGNOSIS — R262 Difficulty in walking, not elsewhere classified: Secondary | ICD-10-CM | POA: Diagnosis not present

## 2023-04-07 DIAGNOSIS — R262 Difficulty in walking, not elsewhere classified: Secondary | ICD-10-CM | POA: Diagnosis not present

## 2023-04-10 DIAGNOSIS — R262 Difficulty in walking, not elsewhere classified: Secondary | ICD-10-CM | POA: Diagnosis not present

## 2023-04-11 DIAGNOSIS — Z419 Encounter for procedure for purposes other than remedying health state, unspecified: Secondary | ICD-10-CM | POA: Diagnosis not present

## 2023-04-11 DIAGNOSIS — R262 Difficulty in walking, not elsewhere classified: Secondary | ICD-10-CM | POA: Diagnosis not present

## 2023-04-12 DIAGNOSIS — R262 Difficulty in walking, not elsewhere classified: Secondary | ICD-10-CM | POA: Diagnosis not present

## 2023-04-13 DIAGNOSIS — R262 Difficulty in walking, not elsewhere classified: Secondary | ICD-10-CM | POA: Diagnosis not present

## 2023-04-14 DIAGNOSIS — R262 Difficulty in walking, not elsewhere classified: Secondary | ICD-10-CM | POA: Diagnosis not present

## 2023-04-17 DIAGNOSIS — R262 Difficulty in walking, not elsewhere classified: Secondary | ICD-10-CM | POA: Diagnosis not present

## 2023-04-18 DIAGNOSIS — R262 Difficulty in walking, not elsewhere classified: Secondary | ICD-10-CM | POA: Diagnosis not present

## 2023-04-19 DIAGNOSIS — R262 Difficulty in walking, not elsewhere classified: Secondary | ICD-10-CM | POA: Diagnosis not present

## 2023-04-21 DIAGNOSIS — R262 Difficulty in walking, not elsewhere classified: Secondary | ICD-10-CM | POA: Diagnosis not present

## 2023-04-24 DIAGNOSIS — R262 Difficulty in walking, not elsewhere classified: Secondary | ICD-10-CM | POA: Diagnosis not present

## 2023-04-25 DIAGNOSIS — R262 Difficulty in walking, not elsewhere classified: Secondary | ICD-10-CM | POA: Diagnosis not present

## 2023-04-26 DIAGNOSIS — Z89511 Acquired absence of right leg below knee: Secondary | ICD-10-CM | POA: Diagnosis not present

## 2023-04-26 DIAGNOSIS — R262 Difficulty in walking, not elsewhere classified: Secondary | ICD-10-CM | POA: Diagnosis not present

## 2023-04-27 DIAGNOSIS — R262 Difficulty in walking, not elsewhere classified: Secondary | ICD-10-CM | POA: Diagnosis not present

## 2023-04-28 DIAGNOSIS — R262 Difficulty in walking, not elsewhere classified: Secondary | ICD-10-CM | POA: Diagnosis not present

## 2023-05-01 DIAGNOSIS — R262 Difficulty in walking, not elsewhere classified: Secondary | ICD-10-CM | POA: Diagnosis not present

## 2023-05-02 DIAGNOSIS — R262 Difficulty in walking, not elsewhere classified: Secondary | ICD-10-CM | POA: Diagnosis not present

## 2023-05-03 DIAGNOSIS — R262 Difficulty in walking, not elsewhere classified: Secondary | ICD-10-CM | POA: Diagnosis not present

## 2023-05-04 DIAGNOSIS — R262 Difficulty in walking, not elsewhere classified: Secondary | ICD-10-CM | POA: Diagnosis not present

## 2023-05-05 DIAGNOSIS — R262 Difficulty in walking, not elsewhere classified: Secondary | ICD-10-CM | POA: Diagnosis not present

## 2023-05-08 DIAGNOSIS — R262 Difficulty in walking, not elsewhere classified: Secondary | ICD-10-CM | POA: Diagnosis not present

## 2023-05-09 DIAGNOSIS — R262 Difficulty in walking, not elsewhere classified: Secondary | ICD-10-CM | POA: Diagnosis not present

## 2023-05-10 DIAGNOSIS — R262 Difficulty in walking, not elsewhere classified: Secondary | ICD-10-CM | POA: Diagnosis not present

## 2023-05-11 DIAGNOSIS — R262 Difficulty in walking, not elsewhere classified: Secondary | ICD-10-CM | POA: Diagnosis not present

## 2023-05-12 DIAGNOSIS — Z419 Encounter for procedure for purposes other than remedying health state, unspecified: Secondary | ICD-10-CM | POA: Diagnosis not present

## 2023-05-12 DIAGNOSIS — R262 Difficulty in walking, not elsewhere classified: Secondary | ICD-10-CM | POA: Diagnosis not present

## 2023-05-15 DIAGNOSIS — R262 Difficulty in walking, not elsewhere classified: Secondary | ICD-10-CM | POA: Diagnosis not present

## 2023-05-16 DIAGNOSIS — R262 Difficulty in walking, not elsewhere classified: Secondary | ICD-10-CM | POA: Diagnosis not present

## 2023-05-17 DIAGNOSIS — R262 Difficulty in walking, not elsewhere classified: Secondary | ICD-10-CM | POA: Diagnosis not present

## 2023-05-18 DIAGNOSIS — R262 Difficulty in walking, not elsewhere classified: Secondary | ICD-10-CM | POA: Diagnosis not present

## 2023-05-19 DIAGNOSIS — R262 Difficulty in walking, not elsewhere classified: Secondary | ICD-10-CM | POA: Diagnosis not present

## 2023-05-22 DIAGNOSIS — R262 Difficulty in walking, not elsewhere classified: Secondary | ICD-10-CM | POA: Diagnosis not present

## 2023-05-23 DIAGNOSIS — R262 Difficulty in walking, not elsewhere classified: Secondary | ICD-10-CM | POA: Diagnosis not present

## 2023-05-24 DIAGNOSIS — R262 Difficulty in walking, not elsewhere classified: Secondary | ICD-10-CM | POA: Diagnosis not present

## 2023-05-25 DIAGNOSIS — R262 Difficulty in walking, not elsewhere classified: Secondary | ICD-10-CM | POA: Diagnosis not present

## 2023-05-26 DIAGNOSIS — R262 Difficulty in walking, not elsewhere classified: Secondary | ICD-10-CM | POA: Diagnosis not present

## 2023-05-29 DIAGNOSIS — R262 Difficulty in walking, not elsewhere classified: Secondary | ICD-10-CM | POA: Diagnosis not present

## 2023-05-30 DIAGNOSIS — R262 Difficulty in walking, not elsewhere classified: Secondary | ICD-10-CM | POA: Diagnosis not present

## 2023-05-31 DIAGNOSIS — R262 Difficulty in walking, not elsewhere classified: Secondary | ICD-10-CM | POA: Diagnosis not present

## 2023-06-01 DIAGNOSIS — R262 Difficulty in walking, not elsewhere classified: Secondary | ICD-10-CM | POA: Diagnosis not present

## 2023-06-02 DIAGNOSIS — R262 Difficulty in walking, not elsewhere classified: Secondary | ICD-10-CM | POA: Diagnosis not present

## 2023-06-05 DIAGNOSIS — R262 Difficulty in walking, not elsewhere classified: Secondary | ICD-10-CM | POA: Diagnosis not present

## 2023-06-06 DIAGNOSIS — R262 Difficulty in walking, not elsewhere classified: Secondary | ICD-10-CM | POA: Diagnosis not present

## 2023-06-07 DIAGNOSIS — R262 Difficulty in walking, not elsewhere classified: Secondary | ICD-10-CM | POA: Diagnosis not present

## 2023-06-08 DIAGNOSIS — R262 Difficulty in walking, not elsewhere classified: Secondary | ICD-10-CM | POA: Diagnosis not present

## 2023-06-09 DIAGNOSIS — R262 Difficulty in walking, not elsewhere classified: Secondary | ICD-10-CM | POA: Diagnosis not present

## 2023-06-11 DIAGNOSIS — Z419 Encounter for procedure for purposes other than remedying health state, unspecified: Secondary | ICD-10-CM | POA: Diagnosis not present

## 2023-06-12 DIAGNOSIS — R262 Difficulty in walking, not elsewhere classified: Secondary | ICD-10-CM | POA: Diagnosis not present

## 2023-06-13 DIAGNOSIS — R262 Difficulty in walking, not elsewhere classified: Secondary | ICD-10-CM | POA: Diagnosis not present

## 2023-06-15 DIAGNOSIS — R262 Difficulty in walking, not elsewhere classified: Secondary | ICD-10-CM | POA: Diagnosis not present

## 2023-06-16 DIAGNOSIS — R262 Difficulty in walking, not elsewhere classified: Secondary | ICD-10-CM | POA: Diagnosis not present

## 2023-06-19 DIAGNOSIS — R262 Difficulty in walking, not elsewhere classified: Secondary | ICD-10-CM | POA: Diagnosis not present

## 2023-06-20 DIAGNOSIS — R262 Difficulty in walking, not elsewhere classified: Secondary | ICD-10-CM | POA: Diagnosis not present

## 2023-06-21 DIAGNOSIS — R262 Difficulty in walking, not elsewhere classified: Secondary | ICD-10-CM | POA: Diagnosis not present

## 2023-06-22 ENCOUNTER — Encounter (HOSPITAL_BASED_OUTPATIENT_CLINIC_OR_DEPARTMENT_OTHER): Payer: Self-pay | Admitting: Emergency Medicine

## 2023-06-22 DIAGNOSIS — R262 Difficulty in walking, not elsewhere classified: Secondary | ICD-10-CM | POA: Diagnosis not present

## 2023-06-23 DIAGNOSIS — R262 Difficulty in walking, not elsewhere classified: Secondary | ICD-10-CM | POA: Diagnosis not present

## 2023-06-26 DIAGNOSIS — R262 Difficulty in walking, not elsewhere classified: Secondary | ICD-10-CM | POA: Diagnosis not present

## 2023-06-27 DIAGNOSIS — R262 Difficulty in walking, not elsewhere classified: Secondary | ICD-10-CM | POA: Diagnosis not present

## 2023-06-28 DIAGNOSIS — R262 Difficulty in walking, not elsewhere classified: Secondary | ICD-10-CM | POA: Diagnosis not present

## 2023-06-29 DIAGNOSIS — R262 Difficulty in walking, not elsewhere classified: Secondary | ICD-10-CM | POA: Diagnosis not present

## 2023-06-30 DIAGNOSIS — R262 Difficulty in walking, not elsewhere classified: Secondary | ICD-10-CM | POA: Diagnosis not present

## 2023-07-03 DIAGNOSIS — R262 Difficulty in walking, not elsewhere classified: Secondary | ICD-10-CM | POA: Diagnosis not present

## 2023-07-04 DIAGNOSIS — R262 Difficulty in walking, not elsewhere classified: Secondary | ICD-10-CM | POA: Diagnosis not present

## 2023-07-05 DIAGNOSIS — R262 Difficulty in walking, not elsewhere classified: Secondary | ICD-10-CM | POA: Diagnosis not present

## 2023-07-06 DIAGNOSIS — R262 Difficulty in walking, not elsewhere classified: Secondary | ICD-10-CM | POA: Diagnosis not present

## 2023-07-07 DIAGNOSIS — R262 Difficulty in walking, not elsewhere classified: Secondary | ICD-10-CM | POA: Diagnosis not present

## 2023-07-10 DIAGNOSIS — R262 Difficulty in walking, not elsewhere classified: Secondary | ICD-10-CM | POA: Diagnosis not present

## 2023-07-11 DIAGNOSIS — R262 Difficulty in walking, not elsewhere classified: Secondary | ICD-10-CM | POA: Diagnosis not present

## 2023-07-12 DIAGNOSIS — Z419 Encounter for procedure for purposes other than remedying health state, unspecified: Secondary | ICD-10-CM | POA: Diagnosis not present

## 2023-07-12 DIAGNOSIS — R262 Difficulty in walking, not elsewhere classified: Secondary | ICD-10-CM | POA: Diagnosis not present

## 2023-07-13 DIAGNOSIS — R262 Difficulty in walking, not elsewhere classified: Secondary | ICD-10-CM | POA: Diagnosis not present

## 2023-07-14 DIAGNOSIS — R262 Difficulty in walking, not elsewhere classified: Secondary | ICD-10-CM | POA: Diagnosis not present

## 2023-07-17 DIAGNOSIS — R262 Difficulty in walking, not elsewhere classified: Secondary | ICD-10-CM | POA: Diagnosis not present

## 2023-07-19 DIAGNOSIS — R262 Difficulty in walking, not elsewhere classified: Secondary | ICD-10-CM | POA: Diagnosis not present

## 2023-07-20 DIAGNOSIS — R262 Difficulty in walking, not elsewhere classified: Secondary | ICD-10-CM | POA: Diagnosis not present

## 2023-07-21 DIAGNOSIS — R262 Difficulty in walking, not elsewhere classified: Secondary | ICD-10-CM | POA: Diagnosis not present

## 2023-07-24 DIAGNOSIS — R262 Difficulty in walking, not elsewhere classified: Secondary | ICD-10-CM | POA: Diagnosis not present

## 2023-07-25 DIAGNOSIS — R262 Difficulty in walking, not elsewhere classified: Secondary | ICD-10-CM | POA: Diagnosis not present

## 2023-07-26 DIAGNOSIS — R262 Difficulty in walking, not elsewhere classified: Secondary | ICD-10-CM | POA: Diagnosis not present

## 2023-07-27 DIAGNOSIS — R262 Difficulty in walking, not elsewhere classified: Secondary | ICD-10-CM | POA: Diagnosis not present

## 2023-07-28 DIAGNOSIS — R262 Difficulty in walking, not elsewhere classified: Secondary | ICD-10-CM | POA: Diagnosis not present

## 2023-07-31 DIAGNOSIS — R262 Difficulty in walking, not elsewhere classified: Secondary | ICD-10-CM | POA: Diagnosis not present

## 2023-08-01 DIAGNOSIS — R262 Difficulty in walking, not elsewhere classified: Secondary | ICD-10-CM | POA: Diagnosis not present

## 2023-08-02 DIAGNOSIS — R262 Difficulty in walking, not elsewhere classified: Secondary | ICD-10-CM | POA: Diagnosis not present

## 2023-08-03 DIAGNOSIS — R262 Difficulty in walking, not elsewhere classified: Secondary | ICD-10-CM | POA: Diagnosis not present

## 2023-08-04 DIAGNOSIS — R262 Difficulty in walking, not elsewhere classified: Secondary | ICD-10-CM | POA: Diagnosis not present

## 2023-08-07 DIAGNOSIS — R262 Difficulty in walking, not elsewhere classified: Secondary | ICD-10-CM | POA: Diagnosis not present

## 2023-08-08 DIAGNOSIS — R262 Difficulty in walking, not elsewhere classified: Secondary | ICD-10-CM | POA: Diagnosis not present

## 2023-08-09 DIAGNOSIS — R262 Difficulty in walking, not elsewhere classified: Secondary | ICD-10-CM | POA: Diagnosis not present

## 2023-08-10 DIAGNOSIS — R262 Difficulty in walking, not elsewhere classified: Secondary | ICD-10-CM | POA: Diagnosis not present

## 2023-08-11 DIAGNOSIS — R262 Difficulty in walking, not elsewhere classified: Secondary | ICD-10-CM | POA: Diagnosis not present

## 2023-08-12 DIAGNOSIS — Z419 Encounter for procedure for purposes other than remedying health state, unspecified: Secondary | ICD-10-CM | POA: Diagnosis not present

## 2023-08-14 DIAGNOSIS — R262 Difficulty in walking, not elsewhere classified: Secondary | ICD-10-CM | POA: Diagnosis not present

## 2023-08-15 DIAGNOSIS — R262 Difficulty in walking, not elsewhere classified: Secondary | ICD-10-CM | POA: Diagnosis not present

## 2023-08-16 DIAGNOSIS — R262 Difficulty in walking, not elsewhere classified: Secondary | ICD-10-CM | POA: Diagnosis not present

## 2023-08-17 DIAGNOSIS — R262 Difficulty in walking, not elsewhere classified: Secondary | ICD-10-CM | POA: Diagnosis not present

## 2023-08-18 DIAGNOSIS — R262 Difficulty in walking, not elsewhere classified: Secondary | ICD-10-CM | POA: Diagnosis not present

## 2023-08-21 DIAGNOSIS — R262 Difficulty in walking, not elsewhere classified: Secondary | ICD-10-CM | POA: Diagnosis not present

## 2023-08-22 DIAGNOSIS — R262 Difficulty in walking, not elsewhere classified: Secondary | ICD-10-CM | POA: Diagnosis not present

## 2023-08-23 DIAGNOSIS — R262 Difficulty in walking, not elsewhere classified: Secondary | ICD-10-CM | POA: Diagnosis not present

## 2023-08-24 DIAGNOSIS — R262 Difficulty in walking, not elsewhere classified: Secondary | ICD-10-CM | POA: Diagnosis not present

## 2023-08-25 DIAGNOSIS — R262 Difficulty in walking, not elsewhere classified: Secondary | ICD-10-CM | POA: Diagnosis not present

## 2023-08-28 DIAGNOSIS — R262 Difficulty in walking, not elsewhere classified: Secondary | ICD-10-CM | POA: Diagnosis not present

## 2023-08-29 DIAGNOSIS — R262 Difficulty in walking, not elsewhere classified: Secondary | ICD-10-CM | POA: Diagnosis not present

## 2023-08-30 DIAGNOSIS — R262 Difficulty in walking, not elsewhere classified: Secondary | ICD-10-CM | POA: Diagnosis not present

## 2023-08-31 DIAGNOSIS — R262 Difficulty in walking, not elsewhere classified: Secondary | ICD-10-CM | POA: Diagnosis not present

## 2023-09-01 DIAGNOSIS — R262 Difficulty in walking, not elsewhere classified: Secondary | ICD-10-CM | POA: Diagnosis not present

## 2023-09-04 DIAGNOSIS — R262 Difficulty in walking, not elsewhere classified: Secondary | ICD-10-CM | POA: Diagnosis not present

## 2023-09-05 DIAGNOSIS — R262 Difficulty in walking, not elsewhere classified: Secondary | ICD-10-CM | POA: Diagnosis not present

## 2023-09-06 DIAGNOSIS — R262 Difficulty in walking, not elsewhere classified: Secondary | ICD-10-CM | POA: Diagnosis not present

## 2023-09-07 DIAGNOSIS — R262 Difficulty in walking, not elsewhere classified: Secondary | ICD-10-CM | POA: Diagnosis not present

## 2023-09-08 DIAGNOSIS — R262 Difficulty in walking, not elsewhere classified: Secondary | ICD-10-CM | POA: Diagnosis not present

## 2023-09-09 DIAGNOSIS — Z419 Encounter for procedure for purposes other than remedying health state, unspecified: Secondary | ICD-10-CM | POA: Diagnosis not present

## 2023-09-11 DIAGNOSIS — R262 Difficulty in walking, not elsewhere classified: Secondary | ICD-10-CM | POA: Diagnosis not present

## 2023-09-12 DIAGNOSIS — R262 Difficulty in walking, not elsewhere classified: Secondary | ICD-10-CM | POA: Diagnosis not present

## 2023-09-13 DIAGNOSIS — R262 Difficulty in walking, not elsewhere classified: Secondary | ICD-10-CM | POA: Diagnosis not present

## 2023-09-14 DIAGNOSIS — R262 Difficulty in walking, not elsewhere classified: Secondary | ICD-10-CM | POA: Diagnosis not present

## 2023-09-15 DIAGNOSIS — R262 Difficulty in walking, not elsewhere classified: Secondary | ICD-10-CM | POA: Diagnosis not present

## 2023-09-18 DIAGNOSIS — R262 Difficulty in walking, not elsewhere classified: Secondary | ICD-10-CM | POA: Diagnosis not present

## 2023-09-19 DIAGNOSIS — R262 Difficulty in walking, not elsewhere classified: Secondary | ICD-10-CM | POA: Diagnosis not present

## 2023-09-20 DIAGNOSIS — R262 Difficulty in walking, not elsewhere classified: Secondary | ICD-10-CM | POA: Diagnosis not present

## 2023-09-21 DIAGNOSIS — R262 Difficulty in walking, not elsewhere classified: Secondary | ICD-10-CM | POA: Diagnosis not present

## 2023-09-22 DIAGNOSIS — R262 Difficulty in walking, not elsewhere classified: Secondary | ICD-10-CM | POA: Diagnosis not present

## 2023-09-25 DIAGNOSIS — R262 Difficulty in walking, not elsewhere classified: Secondary | ICD-10-CM | POA: Diagnosis not present

## 2023-09-26 DIAGNOSIS — R262 Difficulty in walking, not elsewhere classified: Secondary | ICD-10-CM | POA: Diagnosis not present

## 2023-09-27 DIAGNOSIS — R262 Difficulty in walking, not elsewhere classified: Secondary | ICD-10-CM | POA: Diagnosis not present

## 2023-09-28 DIAGNOSIS — R262 Difficulty in walking, not elsewhere classified: Secondary | ICD-10-CM | POA: Diagnosis not present

## 2023-09-29 DIAGNOSIS — R262 Difficulty in walking, not elsewhere classified: Secondary | ICD-10-CM | POA: Diagnosis not present

## 2023-10-02 DIAGNOSIS — R262 Difficulty in walking, not elsewhere classified: Secondary | ICD-10-CM | POA: Diagnosis not present

## 2023-10-03 DIAGNOSIS — R262 Difficulty in walking, not elsewhere classified: Secondary | ICD-10-CM | POA: Diagnosis not present

## 2023-10-04 DIAGNOSIS — R262 Difficulty in walking, not elsewhere classified: Secondary | ICD-10-CM | POA: Diagnosis not present

## 2023-10-06 DIAGNOSIS — R262 Difficulty in walking, not elsewhere classified: Secondary | ICD-10-CM | POA: Diagnosis not present

## 2023-10-09 DIAGNOSIS — R262 Difficulty in walking, not elsewhere classified: Secondary | ICD-10-CM | POA: Diagnosis not present

## 2023-10-10 DIAGNOSIS — R262 Difficulty in walking, not elsewhere classified: Secondary | ICD-10-CM | POA: Diagnosis not present

## 2023-10-11 DIAGNOSIS — J309 Allergic rhinitis, unspecified: Secondary | ICD-10-CM | POA: Diagnosis not present

## 2023-10-11 DIAGNOSIS — H60503 Unspecified acute noninfective otitis externa, bilateral: Secondary | ICD-10-CM | POA: Diagnosis not present

## 2023-10-11 DIAGNOSIS — R109 Unspecified abdominal pain: Secondary | ICD-10-CM | POA: Diagnosis not present

## 2023-10-11 DIAGNOSIS — R262 Difficulty in walking, not elsewhere classified: Secondary | ICD-10-CM | POA: Diagnosis not present

## 2023-10-11 DIAGNOSIS — Z0000 Encounter for general adult medical examination without abnormal findings: Secondary | ICD-10-CM | POA: Diagnosis not present

## 2023-10-11 DIAGNOSIS — K5900 Constipation, unspecified: Secondary | ICD-10-CM | POA: Diagnosis not present

## 2023-10-12 DIAGNOSIS — R262 Difficulty in walking, not elsewhere classified: Secondary | ICD-10-CM | POA: Diagnosis not present

## 2023-10-13 DIAGNOSIS — R262 Difficulty in walking, not elsewhere classified: Secondary | ICD-10-CM | POA: Diagnosis not present

## 2023-10-16 DIAGNOSIS — R262 Difficulty in walking, not elsewhere classified: Secondary | ICD-10-CM | POA: Diagnosis not present

## 2023-10-17 DIAGNOSIS — R262 Difficulty in walking, not elsewhere classified: Secondary | ICD-10-CM | POA: Diagnosis not present

## 2023-10-18 DIAGNOSIS — R262 Difficulty in walking, not elsewhere classified: Secondary | ICD-10-CM | POA: Diagnosis not present

## 2023-10-19 DIAGNOSIS — R262 Difficulty in walking, not elsewhere classified: Secondary | ICD-10-CM | POA: Diagnosis not present

## 2023-10-20 DIAGNOSIS — R262 Difficulty in walking, not elsewhere classified: Secondary | ICD-10-CM | POA: Diagnosis not present

## 2023-10-21 DIAGNOSIS — Z419 Encounter for procedure for purposes other than remedying health state, unspecified: Secondary | ICD-10-CM | POA: Diagnosis not present

## 2023-10-23 DIAGNOSIS — R262 Difficulty in walking, not elsewhere classified: Secondary | ICD-10-CM | POA: Diagnosis not present

## 2023-10-24 DIAGNOSIS — R262 Difficulty in walking, not elsewhere classified: Secondary | ICD-10-CM | POA: Diagnosis not present

## 2023-10-25 DIAGNOSIS — R262 Difficulty in walking, not elsewhere classified: Secondary | ICD-10-CM | POA: Diagnosis not present

## 2023-10-26 DIAGNOSIS — R262 Difficulty in walking, not elsewhere classified: Secondary | ICD-10-CM | POA: Diagnosis not present

## 2023-10-27 DIAGNOSIS — G8921 Chronic pain due to trauma: Secondary | ICD-10-CM | POA: Diagnosis not present

## 2023-10-27 DIAGNOSIS — Z899 Acquired absence of limb, unspecified: Secondary | ICD-10-CM | POA: Diagnosis not present

## 2023-10-27 DIAGNOSIS — K5904 Chronic idiopathic constipation: Secondary | ICD-10-CM | POA: Diagnosis not present

## 2023-10-27 DIAGNOSIS — R262 Difficulty in walking, not elsewhere classified: Secondary | ICD-10-CM | POA: Diagnosis not present

## 2023-10-27 DIAGNOSIS — J301 Allergic rhinitis due to pollen: Secondary | ICD-10-CM | POA: Diagnosis not present

## 2023-10-27 DIAGNOSIS — Z23 Encounter for immunization: Secondary | ICD-10-CM | POA: Diagnosis not present

## 2023-10-30 DIAGNOSIS — R262 Difficulty in walking, not elsewhere classified: Secondary | ICD-10-CM | POA: Diagnosis not present

## 2023-10-31 DIAGNOSIS — R262 Difficulty in walking, not elsewhere classified: Secondary | ICD-10-CM | POA: Diagnosis not present

## 2023-11-01 DIAGNOSIS — R262 Difficulty in walking, not elsewhere classified: Secondary | ICD-10-CM | POA: Diagnosis not present

## 2023-11-02 DIAGNOSIS — R262 Difficulty in walking, not elsewhere classified: Secondary | ICD-10-CM | POA: Diagnosis not present

## 2023-11-03 DIAGNOSIS — R262 Difficulty in walking, not elsewhere classified: Secondary | ICD-10-CM | POA: Diagnosis not present

## 2023-11-06 DIAGNOSIS — R262 Difficulty in walking, not elsewhere classified: Secondary | ICD-10-CM | POA: Diagnosis not present

## 2023-11-07 DIAGNOSIS — R262 Difficulty in walking, not elsewhere classified: Secondary | ICD-10-CM | POA: Diagnosis not present

## 2023-11-08 DIAGNOSIS — R262 Difficulty in walking, not elsewhere classified: Secondary | ICD-10-CM | POA: Diagnosis not present

## 2023-11-09 DIAGNOSIS — R262 Difficulty in walking, not elsewhere classified: Secondary | ICD-10-CM | POA: Diagnosis not present

## 2023-11-10 DIAGNOSIS — R262 Difficulty in walking, not elsewhere classified: Secondary | ICD-10-CM | POA: Diagnosis not present

## 2023-11-13 DIAGNOSIS — R262 Difficulty in walking, not elsewhere classified: Secondary | ICD-10-CM | POA: Diagnosis not present

## 2023-11-14 DIAGNOSIS — R262 Difficulty in walking, not elsewhere classified: Secondary | ICD-10-CM | POA: Diagnosis not present

## 2023-11-15 DIAGNOSIS — R262 Difficulty in walking, not elsewhere classified: Secondary | ICD-10-CM | POA: Diagnosis not present

## 2023-11-16 DIAGNOSIS — R262 Difficulty in walking, not elsewhere classified: Secondary | ICD-10-CM | POA: Diagnosis not present

## 2023-11-17 DIAGNOSIS — Z1159 Encounter for screening for other viral diseases: Secondary | ICD-10-CM | POA: Diagnosis not present

## 2023-11-17 DIAGNOSIS — G8921 Chronic pain due to trauma: Secondary | ICD-10-CM | POA: Diagnosis not present

## 2023-11-17 DIAGNOSIS — R262 Difficulty in walking, not elsewhere classified: Secondary | ICD-10-CM | POA: Diagnosis not present

## 2023-11-17 DIAGNOSIS — Z0000 Encounter for general adult medical examination without abnormal findings: Secondary | ICD-10-CM | POA: Diagnosis not present

## 2023-11-17 DIAGNOSIS — Z978 Presence of other specified devices: Secondary | ICD-10-CM | POA: Diagnosis not present

## 2023-11-17 DIAGNOSIS — Z899 Acquired absence of limb, unspecified: Secondary | ICD-10-CM | POA: Diagnosis not present

## 2023-11-20 DIAGNOSIS — R262 Difficulty in walking, not elsewhere classified: Secondary | ICD-10-CM | POA: Diagnosis not present

## 2023-11-20 DIAGNOSIS — Z419 Encounter for procedure for purposes other than remedying health state, unspecified: Secondary | ICD-10-CM | POA: Diagnosis not present

## 2023-11-21 DIAGNOSIS — R262 Difficulty in walking, not elsewhere classified: Secondary | ICD-10-CM | POA: Diagnosis not present

## 2023-11-22 DIAGNOSIS — R262 Difficulty in walking, not elsewhere classified: Secondary | ICD-10-CM | POA: Diagnosis not present

## 2023-11-23 DIAGNOSIS — R262 Difficulty in walking, not elsewhere classified: Secondary | ICD-10-CM | POA: Diagnosis not present

## 2023-11-24 DIAGNOSIS — R262 Difficulty in walking, not elsewhere classified: Secondary | ICD-10-CM | POA: Diagnosis not present

## 2023-11-27 DIAGNOSIS — R262 Difficulty in walking, not elsewhere classified: Secondary | ICD-10-CM | POA: Diagnosis not present

## 2023-11-28 DIAGNOSIS — R262 Difficulty in walking, not elsewhere classified: Secondary | ICD-10-CM | POA: Diagnosis not present

## 2023-11-29 DIAGNOSIS — R262 Difficulty in walking, not elsewhere classified: Secondary | ICD-10-CM | POA: Diagnosis not present

## 2023-11-30 DIAGNOSIS — R262 Difficulty in walking, not elsewhere classified: Secondary | ICD-10-CM | POA: Diagnosis not present

## 2023-12-01 DIAGNOSIS — R262 Difficulty in walking, not elsewhere classified: Secondary | ICD-10-CM | POA: Diagnosis not present

## 2023-12-04 DIAGNOSIS — R262 Difficulty in walking, not elsewhere classified: Secondary | ICD-10-CM | POA: Diagnosis not present

## 2023-12-05 DIAGNOSIS — R262 Difficulty in walking, not elsewhere classified: Secondary | ICD-10-CM | POA: Diagnosis not present

## 2023-12-06 DIAGNOSIS — R262 Difficulty in walking, not elsewhere classified: Secondary | ICD-10-CM | POA: Diagnosis not present

## 2023-12-07 DIAGNOSIS — R262 Difficulty in walking, not elsewhere classified: Secondary | ICD-10-CM | POA: Diagnosis not present

## 2023-12-08 DIAGNOSIS — R262 Difficulty in walking, not elsewhere classified: Secondary | ICD-10-CM | POA: Diagnosis not present

## 2023-12-11 DIAGNOSIS — R262 Difficulty in walking, not elsewhere classified: Secondary | ICD-10-CM | POA: Diagnosis not present

## 2023-12-12 DIAGNOSIS — R262 Difficulty in walking, not elsewhere classified: Secondary | ICD-10-CM | POA: Diagnosis not present

## 2023-12-13 DIAGNOSIS — R262 Difficulty in walking, not elsewhere classified: Secondary | ICD-10-CM | POA: Diagnosis not present

## 2023-12-14 DIAGNOSIS — R262 Difficulty in walking, not elsewhere classified: Secondary | ICD-10-CM | POA: Diagnosis not present

## 2023-12-15 DIAGNOSIS — R262 Difficulty in walking, not elsewhere classified: Secondary | ICD-10-CM | POA: Diagnosis not present

## 2023-12-18 DIAGNOSIS — R262 Difficulty in walking, not elsewhere classified: Secondary | ICD-10-CM | POA: Diagnosis not present

## 2023-12-19 DIAGNOSIS — R262 Difficulty in walking, not elsewhere classified: Secondary | ICD-10-CM | POA: Diagnosis not present

## 2023-12-20 DIAGNOSIS — R262 Difficulty in walking, not elsewhere classified: Secondary | ICD-10-CM | POA: Diagnosis not present

## 2023-12-21 DIAGNOSIS — R262 Difficulty in walking, not elsewhere classified: Secondary | ICD-10-CM | POA: Diagnosis not present

## 2023-12-21 DIAGNOSIS — Z419 Encounter for procedure for purposes other than remedying health state, unspecified: Secondary | ICD-10-CM | POA: Diagnosis not present

## 2023-12-22 DIAGNOSIS — R262 Difficulty in walking, not elsewhere classified: Secondary | ICD-10-CM | POA: Diagnosis not present

## 2024-01-20 DIAGNOSIS — Z419 Encounter for procedure for purposes other than remedying health state, unspecified: Secondary | ICD-10-CM | POA: Diagnosis not present

## 2024-02-20 DIAGNOSIS — Z419 Encounter for procedure for purposes other than remedying health state, unspecified: Secondary | ICD-10-CM | POA: Diagnosis not present

## 2024-03-05 DIAGNOSIS — Z89511 Acquired absence of right leg below knee: Secondary | ICD-10-CM | POA: Diagnosis not present

## 2024-03-22 DIAGNOSIS — Z419 Encounter for procedure for purposes other than remedying health state, unspecified: Secondary | ICD-10-CM | POA: Diagnosis not present

## 2024-04-12 DIAGNOSIS — H1031 Unspecified acute conjunctivitis, right eye: Secondary | ICD-10-CM | POA: Diagnosis not present

## 2024-04-12 DIAGNOSIS — F4310 Post-traumatic stress disorder, unspecified: Secondary | ICD-10-CM | POA: Diagnosis not present

## 2024-04-12 DIAGNOSIS — J301 Allergic rhinitis due to pollen: Secondary | ICD-10-CM | POA: Diagnosis not present

## 2024-04-12 DIAGNOSIS — S71131S Puncture wound without foreign body, right thigh, sequela: Secondary | ICD-10-CM | POA: Diagnosis not present
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
## 2024-04-22 DIAGNOSIS — J029 Acute pharyngitis, unspecified: Secondary | ICD-10-CM | POA: Diagnosis not present

## 2024-04-22 DIAGNOSIS — Z20822 Contact with and (suspected) exposure to covid-19: Secondary | ICD-10-CM | POA: Diagnosis not present

## 2024-04-22 DIAGNOSIS — R112 Nausea with vomiting, unspecified: Secondary | ICD-10-CM | POA: Diagnosis not present

## 2024-04-22 NOTE — Progress Notes (Signed)
 SUBJECTIVE:  Stephen Garner is a 23 y.o. male.  History of Present Illness This is a patient with a history of migraines presenting with vomiting, diarrhea, sore throat, cough, and headache.  He began experiencing symptoms around 4:00 or 5:00 AM this morning, which included vomiting, diarrhea, a sore throat, coughing, and a headache. He vomited 4 to 5 times in the morning and 3 times at work. He also reported feeling dizzy at work and nearly fainted, necessitating him to sit down. He reports no recent head injury. He reports no chest pain or urinary issues. He reports no weakness or tingling. He has been sweating excessively and feeling hot, but no fever. He attempted to alleviate his symptoms with nausea medication (zofran ) and a headache pill around 5:00 AM, but these were ineffective. He was able to keep the nausea pill down, but when he got to work, he started throwing up. He has a history of migraines, which have previously caused him to vomit frequently. He reports no vision or hearing problems. He reports no history of chronic kidney or liver disease. Denies testicular pain or dysuria.  He reports body aches and abdominal ache, but no specific area of sharp pain. He experienced wheezing yesterday and began coughing yesterday.  Parts of patient history reviewed include PMH, problem list, medications, allergies, social history, family history, and surgical history.   OBJECTIVE:  Vitals:   04/22/24 1213  BP: 127/86  Pulse: 74  Resp: 18  Temp: 97.9 F (36.6 C)  TempSrc: Tympanic  SpO2: 96%  Weight: 95.4 kg (210 lb 6.4 oz)     General: Patient is well-nourished and in no acute distress.  Skin:  No rash noted. Head:  Normocephalic, atraumatic.   Eyes: EOM intact Ears:  Gross hearing intact.  Canals without erythema or swelling.  TMs pearly bilaterally. Mouth/pharynx: Gingiva and mucosa pink.  Erythema without exudate bilaterally.   Neck: NROM Lungs: CTA bilaterally.  Normal  effort. Heart: NRR with no murmurs, rubs, or gallops.   Neurologic: Alert and oriented. CN 2-12 intact. 5/5 strength in bilateral upper and lower extremities.  5/5 strength with flexion and extension bilateral elbows and knees. Abdomen: soft, mild generalized soreness with no focal tenderness or guarding.  ASSESSMENT:   1. Sore throat  POC Rapid Strep A   POC SARS-COV-2 SYMPTOMATIC (IDNOW)   POC Influenza A&B NAT (IDNOW)      PLAN:  Assessment & Plan Initial Assessment: Symptoms of vomiting, diarrhea, sore throat, cough, headache, dizziness, and body aches since early morning.  No emergent findings on exam.  Normal neuro exam. Denies chest pain.  Differential Diagnosis: - Viral infection: Vomiting, diarrhea, cough, sore throat, headache. COVID-19, flu to identify causative agent. - Strep throat: Sore throat. Strep test to rule out. - Dehydration: Vomiting, diarrhea. Hydration advised. Do not feel due to early onset patient severely dehydrated.  Vitals stable.  Oral rehydration adequate. - Migraine: History of migraines. Tylenol  recommended for headache management as NSAIDs may irritate stomach.  Patient Education: Benjamine foods, hydration, rest.  Rapid strep negative Rapid flu negative Rapid COVID negative  Likely viral.  Hydrate. Rest. Benjamine foods. Zofran  given. Has used in the past without problems.  Call or return with any questions or concerns At this time doubt appendicitis, meningitis, encephalitis, sepsis, severe dehydration, or other emergent cause.  However I spoke at length about changing or worsening of symptoms that should prompt going directly to the closest ER.  Patient is agreeable with assessment and plan.  Urgent Care Disposition:  Home Care  Electronically signed by: Donnice Elspeth Seip, PA-C 04/22/2024 12:41 PM

## 2024-05-27 NOTE — Telephone Encounter (Signed)
 I have a mom calling back  ...  pt Stephen Garner [phone]       wants to speak to you       message keeps hanging up on her ... so not sure how much of the message is going thru and he is in severe pain and stump is swollen   and can see bone is poking up to the point he cannot put his leg on.        Return call to [phone]     thank you

## 2024-05-27 NOTE — Telephone Encounter (Signed)
 Attending: Carrol  Call returned. Two identifiers used. EMR reviewed.  S: Stump swollen and painful with bone sticking up.  B: DATE OF PROCEDURE: 03/12/19   PREOPERATIVE DIAGNOSIS: 1. History of BKA on the right and fasciotomy on the left- bony prominence at the BKA site causing pain; left skin graft site with redundancy.   POSTOPERATIVE DIAGNOSIS: Same   PROCEDURE:  1. Revision below knee amputation on the right 2. Excision heterotopic ossification right 3. Tissue rearrangement and flap advancement right BKA site 19 x 10 cm 4. Left complex closure of leg wound after redundant skin graft excision 26 x 4 cm  A: Stump swollen , warm and extremely painful.  Can see bone beneath skin.  Unable to wear his prosthesis.  No redness, streaks, fever, chills, N or V. Using elevation and ice.  R: appointment tomorrow at 1530.  Call for concerns. Verbalizes understanding and agrees with plan.  Appointment note to: [employer]

## 2024-05-28 DIAGNOSIS — S88111D Complete traumatic amputation at level between knee and ankle, right lower leg, subsequent encounter: Secondary | ICD-10-CM | POA: Diagnosis not present

## 2024-06-05 NOTE — Progress Notes (Signed)
 I have reviewed the resident note and agree with the treatment plan.   Lonni Ozell Abide, MD 06/05/2024 11:12 AM

## 2024-06-13 ENCOUNTER — Emergency Department (HOSPITAL_BASED_OUTPATIENT_CLINIC_OR_DEPARTMENT_OTHER)

## 2024-06-13 ENCOUNTER — Emergency Department (HOSPITAL_BASED_OUTPATIENT_CLINIC_OR_DEPARTMENT_OTHER)
Admission: EM | Admit: 2024-06-13 | Discharge: 2024-06-13 | Disposition: A | Discharge status: Home / Self Care | Attending: Emergency Medicine | Admitting: Emergency Medicine

## 2024-06-13 ENCOUNTER — Other Ambulatory Visit: Payer: Self-pay

## 2024-06-13 DIAGNOSIS — S8001XA Contusion of right knee, initial encounter: Secondary | ICD-10-CM | POA: Diagnosis not present

## 2024-06-13 DIAGNOSIS — W109XXA Fall (on) (from) unspecified stairs and steps, initial encounter: Secondary | ICD-10-CM | POA: Diagnosis not present

## 2024-06-13 DIAGNOSIS — S8011XA Contusion of right lower leg, initial encounter: Secondary | ICD-10-CM | POA: Insufficient documentation

## 2024-06-13 DIAGNOSIS — R519 Headache, unspecified: Secondary | ICD-10-CM | POA: Insufficient documentation

## 2024-06-13 DIAGNOSIS — W19XXXA Unspecified fall, initial encounter: Secondary | ICD-10-CM

## 2024-06-13 DIAGNOSIS — J45909 Unspecified asthma, uncomplicated: Secondary | ICD-10-CM | POA: Insufficient documentation

## 2024-06-13 DIAGNOSIS — Z043 Encounter for examination and observation following other accident: Secondary | ICD-10-CM | POA: Diagnosis not present

## 2024-06-13 DIAGNOSIS — S8991XA Unspecified injury of right lower leg, initial encounter: Secondary | ICD-10-CM | POA: Diagnosis present

## 2024-06-13 MED ORDER — OXYCODONE-ACETAMINOPHEN 5-325 MG PO TABS
1.0000 | ORAL_TABLET | Freq: Once | ORAL | Status: AC
  Administered 2024-06-13: 1 via ORAL
  Filled 2024-06-13: qty 1

## 2024-06-13 MED ORDER — ONDANSETRON 4 MG PO TBDP
4.0000 mg | ORAL_TABLET | Freq: Once | ORAL | Status: AC
  Administered 2024-06-13: 4 mg via ORAL
  Filled 2024-06-13: qty 1

## 2024-06-13 NOTE — Discharge Instructions (Signed)
 You were seen today after a fall.  Your x-rays do not show any obvious complicated injury.  Follow-up as previously scheduled with your plastic surgeon.  Ice and elevate.  Tylenol  or ibuprofen  for pain.

## 2024-06-13 NOTE — ED Triage Notes (Signed)
 Pt POV after mechanical fall down stairs, hx R BKA, was using walker and tripped, reporting R leg pain, abrasion noted on R leg.
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
## 2024-06-17 DIAGNOSIS — S88111D Complete traumatic amputation at level between knee and ankle, right lower leg, subsequent encounter: Secondary | ICD-10-CM | POA: Diagnosis not present

## 2024-07-30 NOTE — Progress Notes (Signed)
 PT in [place] can't see pt, new order sent to [facility] health PT.

## 2024-08-22 ENCOUNTER — Ambulatory Visit: Admitting: Physical Therapy
